# Patient Record
Sex: Male | Born: 2000 | Race: White | Hispanic: No | Marital: Single | Smoking: Never smoker
Health system: Southern US, Community
[De-identification: ages and names within clinical notes are randomized; demographics above are authoritative.]

---
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
## 2019-12-01 ENCOUNTER — Emergency Department (HOSPITAL_COMMUNITY): Payer: Self-pay

## 2019-12-01 ENCOUNTER — Other Ambulatory Visit: Payer: Self-pay

## 2019-12-01 ENCOUNTER — Encounter (HOSPITAL_COMMUNITY): Payer: Self-pay | Admitting: Emergency Medicine

## 2019-12-01 ENCOUNTER — Emergency Department (HOSPITAL_COMMUNITY)
Admission: EM | Admit: 2019-12-01 | Discharge: 2019-12-01 | Disposition: A | Discharge status: Home / Self Care | Payer: Self-pay | Attending: Emergency Medicine | Admitting: Emergency Medicine

## 2019-12-01 DIAGNOSIS — Y939 Activity, unspecified: Secondary | ICD-10-CM | POA: Insufficient documentation

## 2019-12-01 DIAGNOSIS — Y999 Unspecified external cause status: Secondary | ICD-10-CM | POA: Insufficient documentation

## 2019-12-01 DIAGNOSIS — W009XXA Unspecified fall due to ice and snow, initial encounter: Secondary | ICD-10-CM | POA: Insufficient documentation

## 2019-12-01 DIAGNOSIS — Y92009 Unspecified place in unspecified non-institutional (private) residence as the place of occurrence of the external cause: Secondary | ICD-10-CM | POA: Insufficient documentation

## 2019-12-01 DIAGNOSIS — R569 Unspecified convulsions: Secondary | ICD-10-CM

## 2019-12-01 DIAGNOSIS — S0990XA Unspecified injury of head, initial encounter: Secondary | ICD-10-CM

## 2019-12-01 LAB — CBC WITH DIFFERENTIAL/PLATELET
Abs Immature Granulocytes: 0.06 10*3/uL (ref 0.00–0.07)
Basophils Absolute: 0.1 10*3/uL (ref 0.0–0.1)
Basophils Relative: 1 %
Eosinophils Absolute: 0 10*3/uL (ref 0.0–0.5)
Eosinophils Relative: 0 %
HCT: 48.9 % (ref 39.0–52.0)
Hemoglobin: 16.2 g/dL (ref 13.0–17.0)
Immature Granulocytes: 1 %
Lymphocytes Relative: 16 %
Lymphs Abs: 1.6 10*3/uL (ref 0.7–4.0)
MCH: 30.2 pg (ref 26.0–34.0)
MCHC: 33.1 g/dL (ref 30.0–36.0)
MCV: 91.1 fL (ref 80.0–100.0)
Monocytes Absolute: 0.7 10*3/uL (ref 0.1–1.0)
Monocytes Relative: 7 %
Neutro Abs: 7.7 10*3/uL (ref 1.7–7.7)
Neutrophils Relative %: 75 %
Platelets: 295 10*3/uL (ref 150–400)
RBC: 5.37 MIL/uL (ref 4.22–5.81)
RDW: 12 % (ref 11.5–15.5)
WBC: 10.1 10*3/uL (ref 4.0–10.5)
nRBC: 0 % (ref 0.0–0.2)

## 2019-12-01 LAB — COMPREHENSIVE METABOLIC PANEL
ALT: 29 U/L (ref 0–44)
AST: 30 U/L (ref 15–41)
Albumin: 4.9 g/dL (ref 3.5–5.0)
Alkaline Phosphatase: 97 U/L (ref 38–126)
Anion gap: 17 — ABNORMAL HIGH (ref 5–15)
BUN: 9 mg/dL (ref 6–20)
CO2: 18 mmol/L — ABNORMAL LOW (ref 22–32)
Calcium: 9.8 mg/dL (ref 8.9–10.3)
Chloride: 103 mmol/L (ref 98–111)
Creatinine, Ser: 1.21 mg/dL (ref 0.61–1.24)
GFR calc Af Amer: >60 mL/min (ref >60)
GFR calc non Af Amer: >60 mL/min (ref >60)
Glucose, Bld: 119 mg/dL — ABNORMAL HIGH (ref 70–99)
Potassium: 3.6 mmol/L (ref 3.5–5.1)
Sodium: 138 mmol/L (ref 135–145)
Total Bilirubin: 0.7 mg/dL (ref 0.3–1.2)
Total Protein: 7.8 g/dL (ref 6.5–8.1)

## 2019-12-01 NOTE — Discharge Instructions (Signed)
It is not clear why you had a seizure, you will need to follow-up very closely with your doctor as well as with the neurologist.  I have given you the phone number for the neurologist locally.  Please call in the morning on Monday to make an appointment for the next available appointment.  Please be aware that if you have another seizure you need to return to the emergency department immediately.  If you start to feel worse with any headaches, nausea, vomiting, weakness, numbness, changes in vision please return immediately for recheck.  You are not legally allowed to drive a car and please do not swim or do anything more that could be dangerous to yourself or others if you were to have another seizure.

## 2019-12-01 NOTE — ED Notes (Signed)
Discharge instructions reviewed with pt. Pt verbalized understanding.   
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
## 2019-12-01 NOTE — ED Notes (Signed)
Updates to be given to dad phone number [phone] Mr. [employer].
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
## 2020-01-11 ENCOUNTER — Ambulatory Visit: Payer: 59 | Attending: Internal Medicine

## 2020-01-11 DIAGNOSIS — Z23 Encounter for immunization: Secondary | ICD-10-CM

## 2020-01-11 NOTE — Progress Notes (Signed)
   Covid-19 Vaccination Clinic  Name:  DELFINO FRIESEN    MRN: [id number] DOB: 2000/12/28  01/11/2020  Mr. Hoey was observed post Covid-19 immunization for 15 minutes without incident. He was provided with Vaccine Information Sheet and instruction to access the V-Safe system.   Mr. Domzalski was instructed to call 911 with any severe reactions post vaccine: Marland Kitchen Difficulty breathing  . Swelling of face and throat  . A fast heartbeat  . A bad rash all over body  . Dizziness and weakness   Immunizations Administered    Name Date Dose VIS Date Route   Pfizer COVID-19 Vaccine 01/11/2020 12:41 PM 0.3 mL 10/12/2019 Intramuscular   Manufacturer: [employer], [employer]   Lot: [id number]   NDC: [address]-2

## 2020-02-04 ENCOUNTER — Ambulatory Visit: Payer: 59 | Attending: Internal Medicine

## 2020-02-04 DIAGNOSIS — Z23 Encounter for immunization: Secondary | ICD-10-CM

## 2020-02-04 NOTE — Progress Notes (Signed)
   Covid-19 Vaccination Clinic  Name:  DOW BLAHNIK    MRN: [id number] DOB: 05/08/2001  02/04/2020  Mr. Medinger was observed post Covid-19 immunization for 15 minutes without incident. He was provided with Vaccine Information Sheet and instruction to access the V-Safe system.   Mr. Hengel was instructed to call 911 with any severe reactions post vaccine: Marland Kitchen Difficulty breathing  . Swelling of face and throat  . A fast heartbeat  . A bad rash all over body  . Dizziness and weakness   Immunizations Administered    Name Date Dose VIS Date Route   Pfizer COVID-19 Vaccine 02/04/2020  3:02 PM 0.3 mL 10/12/2019 Intramuscular   Manufacturer: [employer], [employer]   Lot: [id number]   NDC: [address]-8

## 2020-12-07 IMAGING — CT CT HEAD W/O CM
3 of 4 series · 14 of 47 positions shown, 16 images · non-contrast
Comparison: None.

CLINICAL DATA: Encephalopathy.

EXAM:
CT HEAD WITHOUT CONTRAST
TECHNIQUE: Contiguous axial images were obtained from the base of the skull
through the vertex without intravenous contrast.

[Series 4: head 2.0 h70h · axial · 0.43mm/px · z∈[+1243,+1359]mm · 8 of 74 slices shown, 10 images]
[im 8/74  brain]
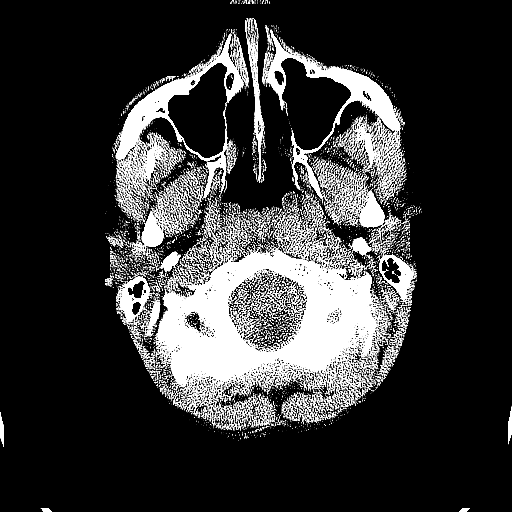
[im 8/74  bone]
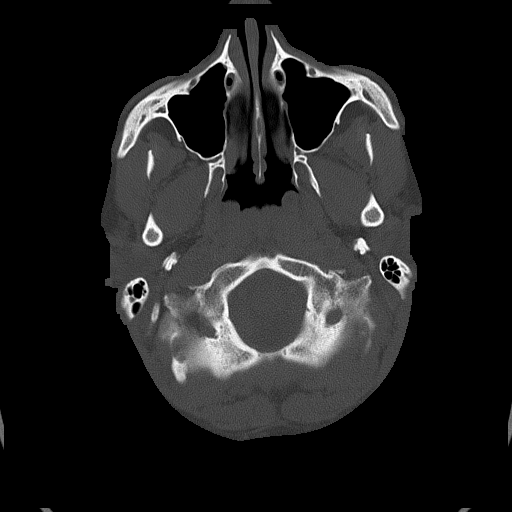
[im 15/74  brain]
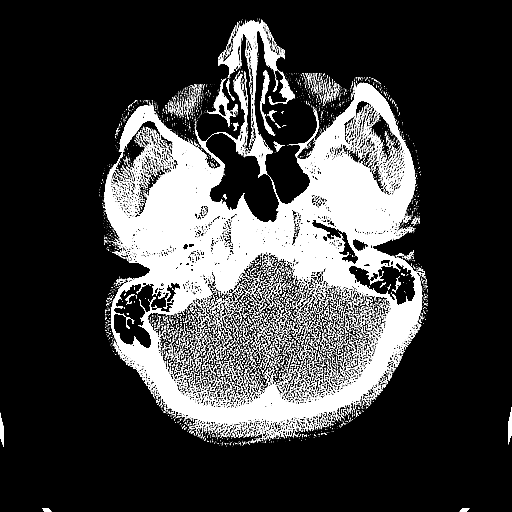
[im 22/74  brain]
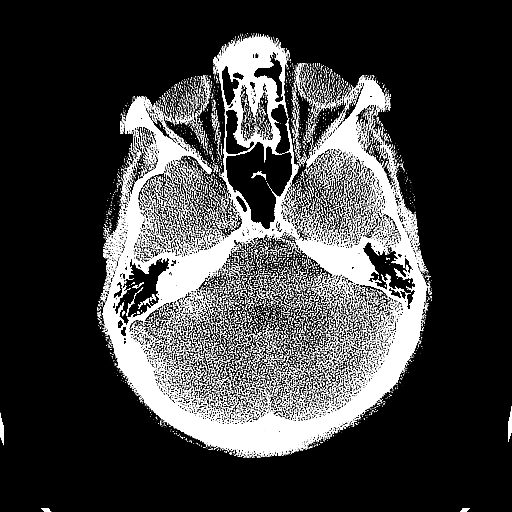
[im 33/74  brain]
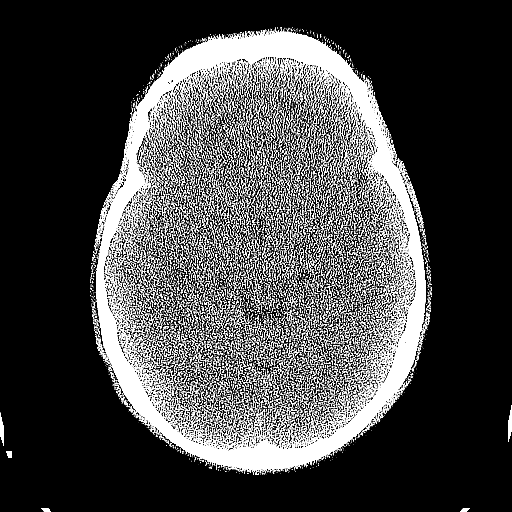
[im 41/74  brain]
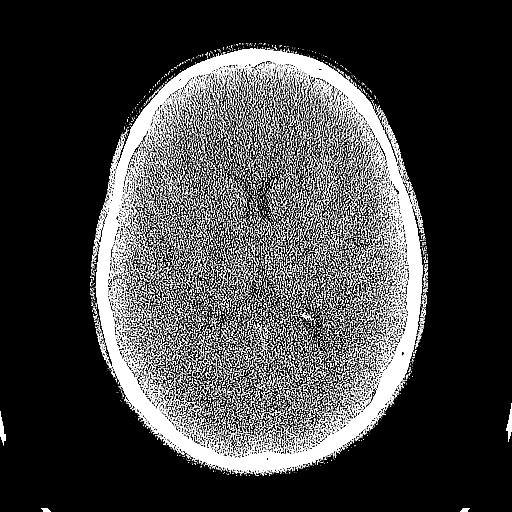
[im 41/74  bone]
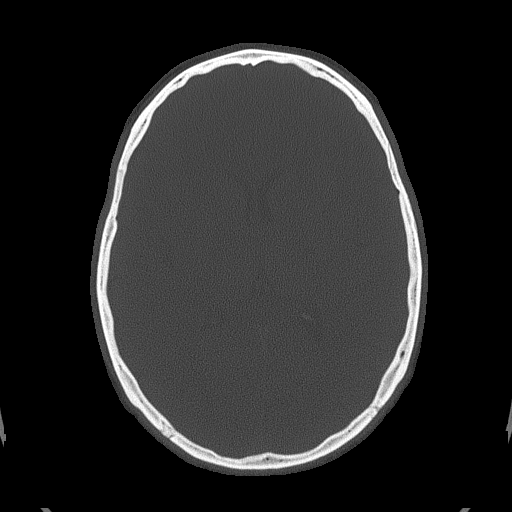
[im 52/74  brain]
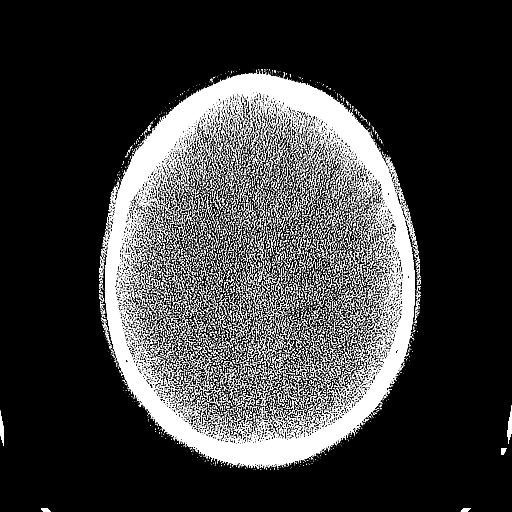
[im 59/74  brain]
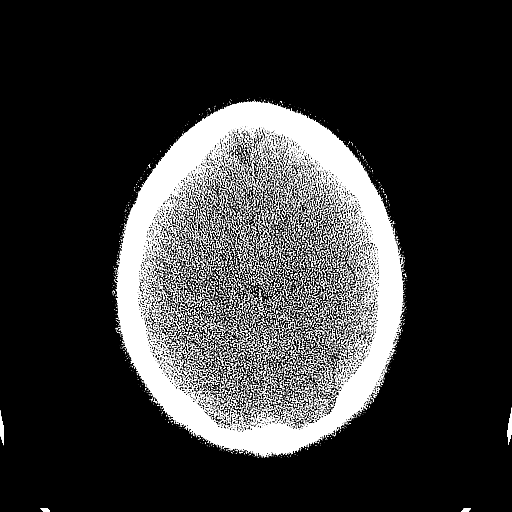
[im 66/74  brain]
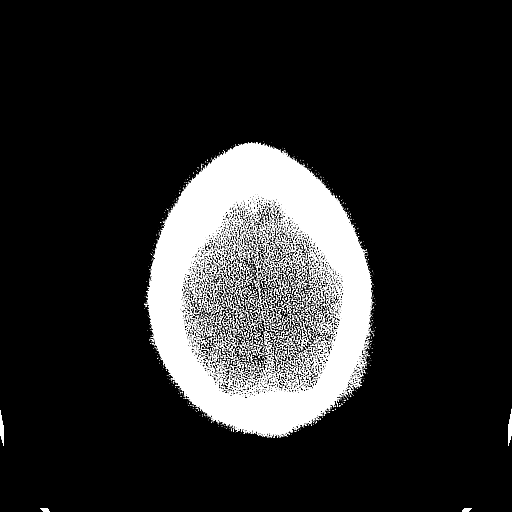

[Series 5: head 3.0 mpr cor · coronal · 0.29mm/px · 3 of 67 slices shown]
[im 23/67  brain]
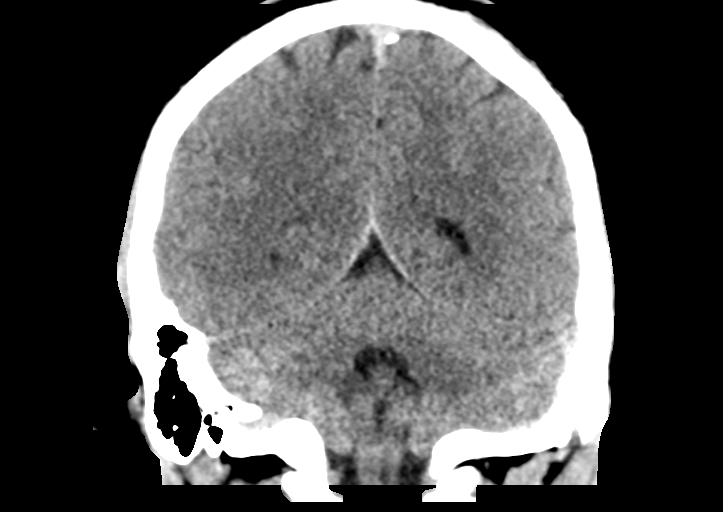
[im 30/67  brain]
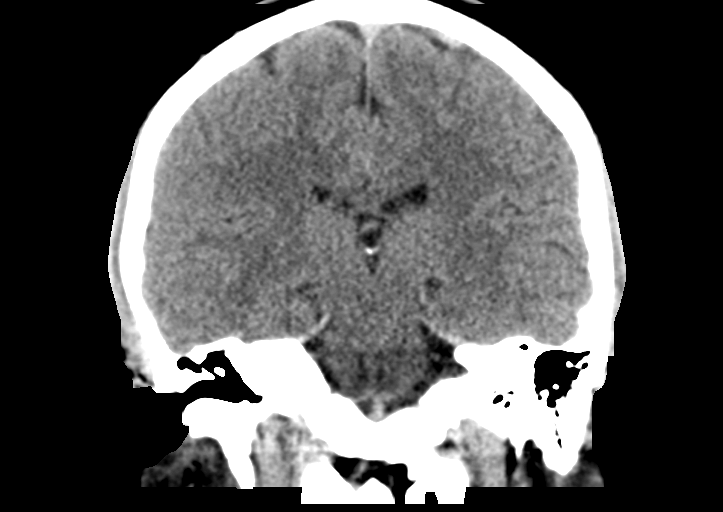
[im 37/67  brain]
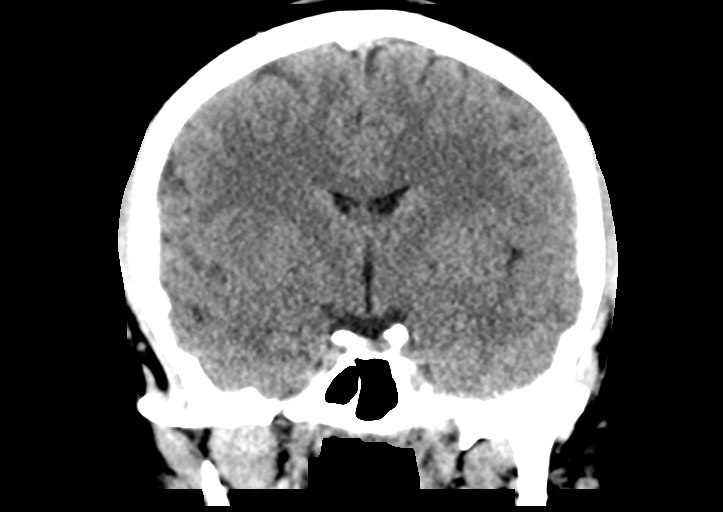

[Series 6: head 3.0 mpr sag · sagittal · 0.33mm/px · 3 of 67 slices shown]
[im 23/67  brain]
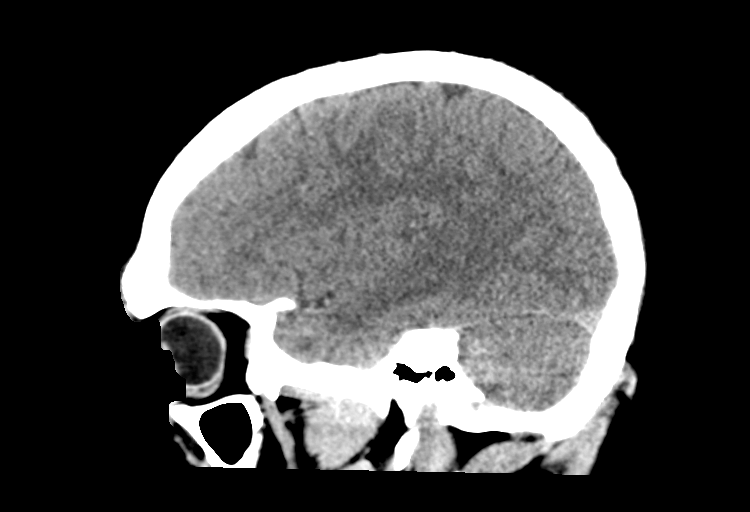
[im 34/67  brain]
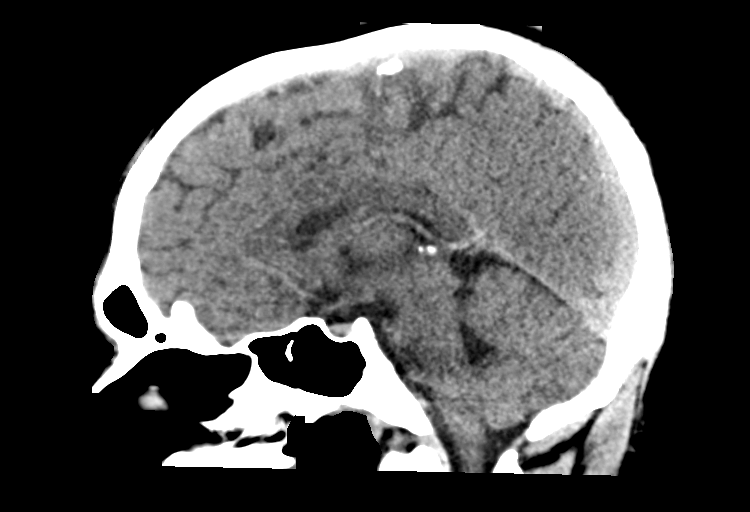
[im 45/67  brain]
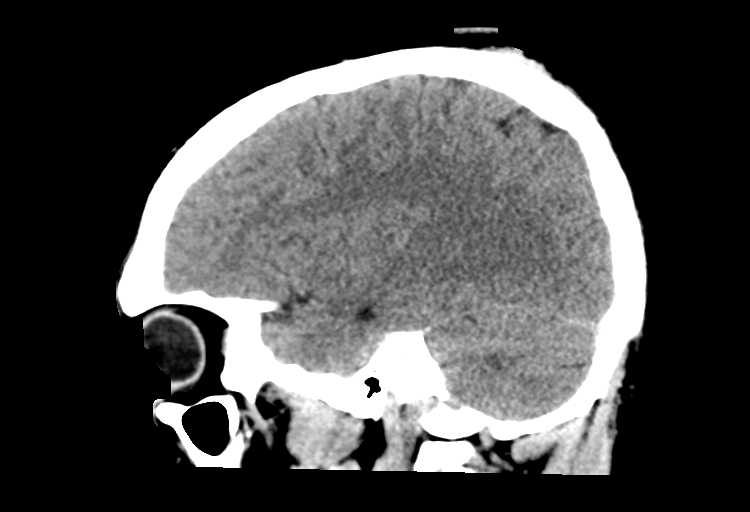

[14 of 47 positions shown; findings below may reference images not displayed]

FINDINGS: Brain: No evidence of acute infarction, hemorrhage, hydrocephalus,
extra-axial collection or mass lesion/mass effect.

Vascular: No hyperdense vessel or unexpected calcification.

Skull: There appears to be some mild posterior vertex scalp swelling
on the left without evidence for an underlying calvarial fracture.

Sinuses/Orbits: No acute finding.

Other: None.
IMPRESSION: No acute intracranial abnormality.

## 2023-01-14 DIAGNOSIS — R030 Elevated blood-pressure reading, without diagnosis of hypertension: Secondary | ICD-10-CM | POA: Diagnosis not present

## 2023-01-14 DIAGNOSIS — R112 Nausea with vomiting, unspecified: Secondary | ICD-10-CM | POA: Diagnosis not present

## 2023-01-14 DIAGNOSIS — Z6833 Body mass index (BMI) 33.0-33.9, adult: Secondary | ICD-10-CM | POA: Diagnosis not present

## 2023-01-14 DIAGNOSIS — R197 Diarrhea, unspecified: Secondary | ICD-10-CM | POA: Diagnosis not present
